# Patient Record
Sex: Female | Born: 1970 | Race: White | Hispanic: No | Marital: Married | Smoking: Never smoker
Health system: Southern US, Community
[De-identification: ages and names within clinical notes are randomized; demographics above are authoritative.]

## PROBLEM LIST (undated history)

## (undated) DIAGNOSIS — G43909 Migraine, unspecified, not intractable, without status migrainosus: Secondary | ICD-10-CM

## (undated) DIAGNOSIS — N289 Disorder of kidney and ureter, unspecified: Secondary | ICD-10-CM

## (undated) HISTORY — PX: CYSTOSCOPY: SUR368

---

## 2016-08-13 ENCOUNTER — Emergency Department (INDEPENDENT_AMBULATORY_CARE_PROVIDER_SITE_OTHER)
Admission: EM | Admit: 2016-08-13 | Discharge: 2016-08-13 | Disposition: A | Discharge status: Home / Self Care | Payer: 59 | Source: Home / Self Care | Attending: Family Medicine | Admitting: Family Medicine

## 2016-08-13 ENCOUNTER — Emergency Department (INDEPENDENT_AMBULATORY_CARE_PROVIDER_SITE_OTHER): Payer: 59

## 2016-08-13 DIAGNOSIS — B349 Viral infection, unspecified: Secondary | ICD-10-CM | POA: Diagnosis not present

## 2016-08-13 DIAGNOSIS — R05 Cough: Secondary | ICD-10-CM

## 2016-08-13 DIAGNOSIS — J9801 Acute bronchospasm: Secondary | ICD-10-CM | POA: Diagnosis not present

## 2016-08-13 DIAGNOSIS — R531 Weakness: Secondary | ICD-10-CM

## 2016-08-13 HISTORY — DX: Migraine, unspecified, not intractable, without status migrainosus: G43.909

## 2016-08-13 HISTORY — DX: Disorder of kidney and ureter, unspecified: N28.9

## 2016-08-13 LAB — COMPLETE METABOLIC PANEL WITH GFR
ALT: 5 U/L — ABNORMAL LOW (ref 6–29)
AST: 12 U/L (ref 10–30)
Albumin: 4.1 g/dL (ref 3.6–5.1)
Alkaline Phosphatase: 84 U/L (ref 33–115)
BUN: 5 mg/dL — ABNORMAL LOW (ref 7–25)
CO2: 31 mmol/L (ref 20–31)
Calcium: 9.2 mg/dL (ref 8.6–10.2)
Chloride: 102 mmol/L (ref 98–110)
Creat: 0.57 mg/dL (ref 0.50–1.10)
GFR, Est African American: >89 mL/min (ref >=60)
GFR, Est Non African American: >89 mL/min (ref >=60)
Glucose, Bld: 119 mg/dL — ABNORMAL HIGH (ref 65–99)
Potassium: 3.5 mmol/L (ref 3.5–5.3)
Sodium: 141 mmol/L (ref 135–146)
Total Bilirubin: 0.3 mg/dL (ref 0.2–1.2)
Total Protein: 6.7 g/dL (ref 6.1–8.1)

## 2016-08-13 LAB — POCT CBC W AUTO DIFF (K'VILLE URGENT CARE)

## 2016-08-13 MED ORDER — ALBUTEROL SULFATE HFA 108 (90 BASE) MCG/ACT IN AERS: 1.0000 | INHALATION_SPRAY | Freq: Four times a day (QID) | RESPIRATORY_TRACT | 0 refills | Status: AC | PRN

## 2016-08-13 MED ORDER — PREDNISONE 20 MG PO TABS: ORAL_TABLET | ORAL | 0 refills | Status: AC

## 2016-08-13 NOTE — ED Triage Notes (Signed)
Pt was seen last Monday, tested neg for flu.  Was given tamiflu, but did not take due to previous issues.  Now has burning in the chest and back, mostly at night and coughs to the point of vomiting at night.

## 2016-08-13 NOTE — ED Notes (Signed)
To x-ray

## 2016-08-13 NOTE — ED Provider Notes (Signed)
CSN: [id number]     Arrival date & time 08/13/16  1346 History   First MD Initiated Contact with Patient 08/13/16 1412     Chief Complaint  Patient presents with  . Fever  . Cough  . Fatigue   (Consider location/radiation/quality/duration/timing/severity/associated sxs/prior Treatment) HPI Ana Malone is a 46 y.o. female presenting to UC with c/o worsening cough that is dry to productive, worse at night while lying down.  She was seen by her PCP earlier last week for flu-like symptoms, tested negative for the flu but was prescribed Tamiflu for possible false-negative.  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  Because of this, she was hesitant to take the Tamiflu and ultimately did not take it.  She did take Azithromycin and completed the course of antibiotic today but is not feeling any better. She has also taken Tessalon, Dayquil and Nyquil w/o relief.  Hx of bronchospasm several years ago and did have an inhaler but denies known asthma or COPD. She has never been a smoker. Denies SOB at this time but does feel burning in her chest and had post-tussive vomiting last night.    Past Medical History:  Diagnosis Date  . Migraines   . Renal disorder    kidney stones   Past Surgical History:  Procedure Laterality Date  . CESAREAN SECTION WITH BILATERAL TUBAL LIGATION    . CYSTOSCOPY     Family History  Problem Relation Age of Onset  . Migraines Mother   . Seizures Mother   . Heart failure Father   . Diabetes Father    Social History  Substance Use Topics  . Smoking status: Never Smoker  . Smokeless tobacco: Not on file  . Alcohol use No   OB History    No data available     Review of Systems  Constitutional: Positive for fatigue. Negative for chills and fever.  HENT: Positive for congestion and sore throat (from cough). Negative for ear pain, trouble swallowing and voice change.    Respiratory: Positive for cough and chest tightness. Negative for shortness of breath.   Cardiovascular: Negative for chest pain and palpitations.  Gastrointestinal: Positive for vomiting ( post-tussive). Negative for abdominal pain, diarrhea and nausea.  Musculoskeletal: Positive for arthralgias, back pain and myalgias.  Skin: Negative for rash.  Neurological: Positive for light-headedness and headaches. Negative for dizziness and syncope.    Allergies  Amoxicillin and Other  Home Medications   Prior to Admission medications   Medication Sig Start Date End Date Taking? Authorizing Provider  butalbital-acetaminophen-caffeine (FIORICET, ESGIC) 50-325-40 MG tablet Take by mouth 2 (two) times daily as needed for headache.   Yes Historical Provider, MD  albuterol (PROVENTIL HFA;VENTOLIN HFA) 108 (90 Base) MCG/ACT inhaler Inhale 1-2 puffs into the lungs every 6 (six) hours as needed for wheezing or shortness of breath. 08/13/16   Junius FinnerErin O'Malley, PA-C  predniSONE (DELTASONE) 20 MG tablet 3 tabs po day one, then 2 po daily x 4 days 08/13/16   Junius FinnerErin O'Malley, PA-C   Meds Ordered and Administered this Visit  Medications - No data to display  BP 119/85 (BP Location: Left Arm)   Pulse 114   Temp 98.4 F (36.9 C) (Tympanic)   Ht 5' 4.5" (1.638 m)   Wt 108 lb (49 kg)   LMP 08/04/2016   SpO2 98%   BMI 18.25 kg/m  No data found.  Physical Exam  Constitutional: She is oriented to person, place, and time. She appears well-developed and well-nourished. No distress.  Thin female sitting on exam bed, NAD. Non-toxic appearing.  HENT:  Head: Normocephalic and atraumatic.  Right Ear: Tympanic membrane normal.  Left Ear: Tympanic membrane normal.  Nose: Nose normal.  Mouth/Throat: Uvula is midline, oropharynx is clear and moist and mucous membranes are normal.  Eyes: EOM are normal.  Neck: Normal range of motion. Neck supple.  Cardiovascular: Normal rate and regular rhythm.   Pulmonary/Chest:  Effort normal and breath sounds normal. No stridor. No respiratory distress. She has no wheezes. She has no rales.  Musculoskeletal: Normal range of motion.  Lymphadenopathy:    She has no cervical adenopathy.  Neurological: She is alert and oriented to person, place, and time.  Skin: Skin is warm and dry. She is not diaphoretic.  Psychiatric: She has a normal mood and affect. Her behavior is normal.  Nursing note and vitals reviewed.   Urgent Care Course     Procedures (including critical care time)  Labs Review Labs Reviewed  COMPLETE METABOLIC PANEL WITH GFR  POCT CBC W AUTO DIFF ([facility])    Imaging Review Dg Chest 2 View  Result Date: 08/13/2016 CLINICAL DATA:  Cough, weakness EXAM: CHEST  2 VIEW COMPARISON:  None. FINDINGS: The lungs are hyperinflated likely secondary to COPD. There is no focal parenchymal opacity. There is no pleural effusion or pneumothorax. The heart and mediastinal contours are unremarkable. The osseous structures are unremarkable. IMPRESSION: No active cardiopulmonary disease. Electronically Signed   By: Elige Ko   On: 08/13/2016 14:40    MDM   1. Acute bronchospasm due to viral infection   2. Generalized weakness    Pt c/o worsening URI symptoms, especially the cough. O2 Sat 98% on RA She did complete a course of Azithromycin today (started on Wednesday)  CXR: no active cardiopulmonary disease. Evidence of possible COPD Pt denies known hx of asthma or COPD, denies ever smoking.   Rx: Prednisone and albuterol inhaler  F/u with PCP later this week if not improving.     Junius Finner, PA-C 08/13/16 1656

## 2016-08-16 ENCOUNTER — Telehealth: Payer: Self-pay | Admitting: *Deleted

## 2016-08-16 NOTE — Telephone Encounter (Signed)
Pt given lab results. Per dr Cathren Harshbeese increase protein intake due to low BUN and f/u with PCP if she fails to improve.

## 2018-05-11 IMAGING — DX DG CHEST 2V
2 series · 2 of 2 positions shown · non-contrast
Comparison: None.

CLINICAL DATA: Cough, weakness

EXAM:
CHEST  2 VIEW

[chest pa]
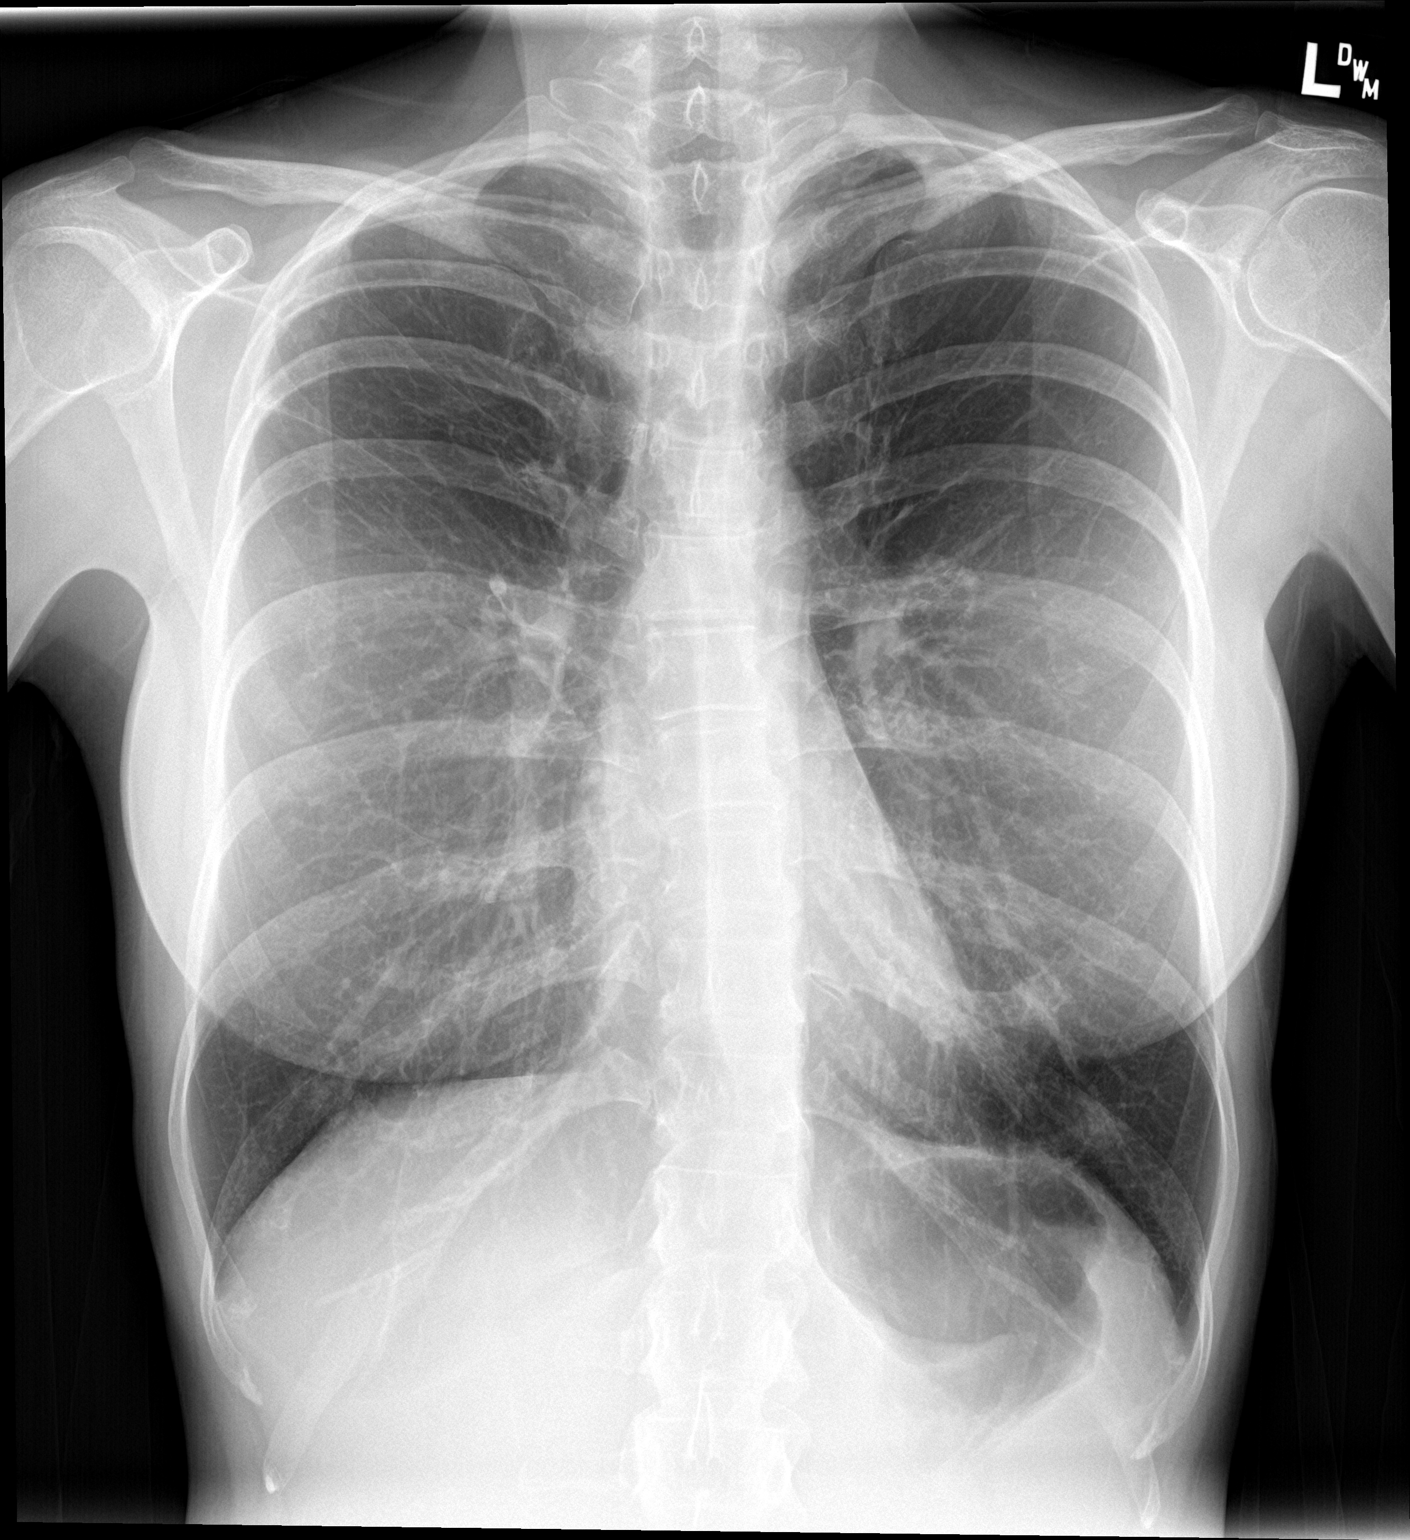

[chest lat]
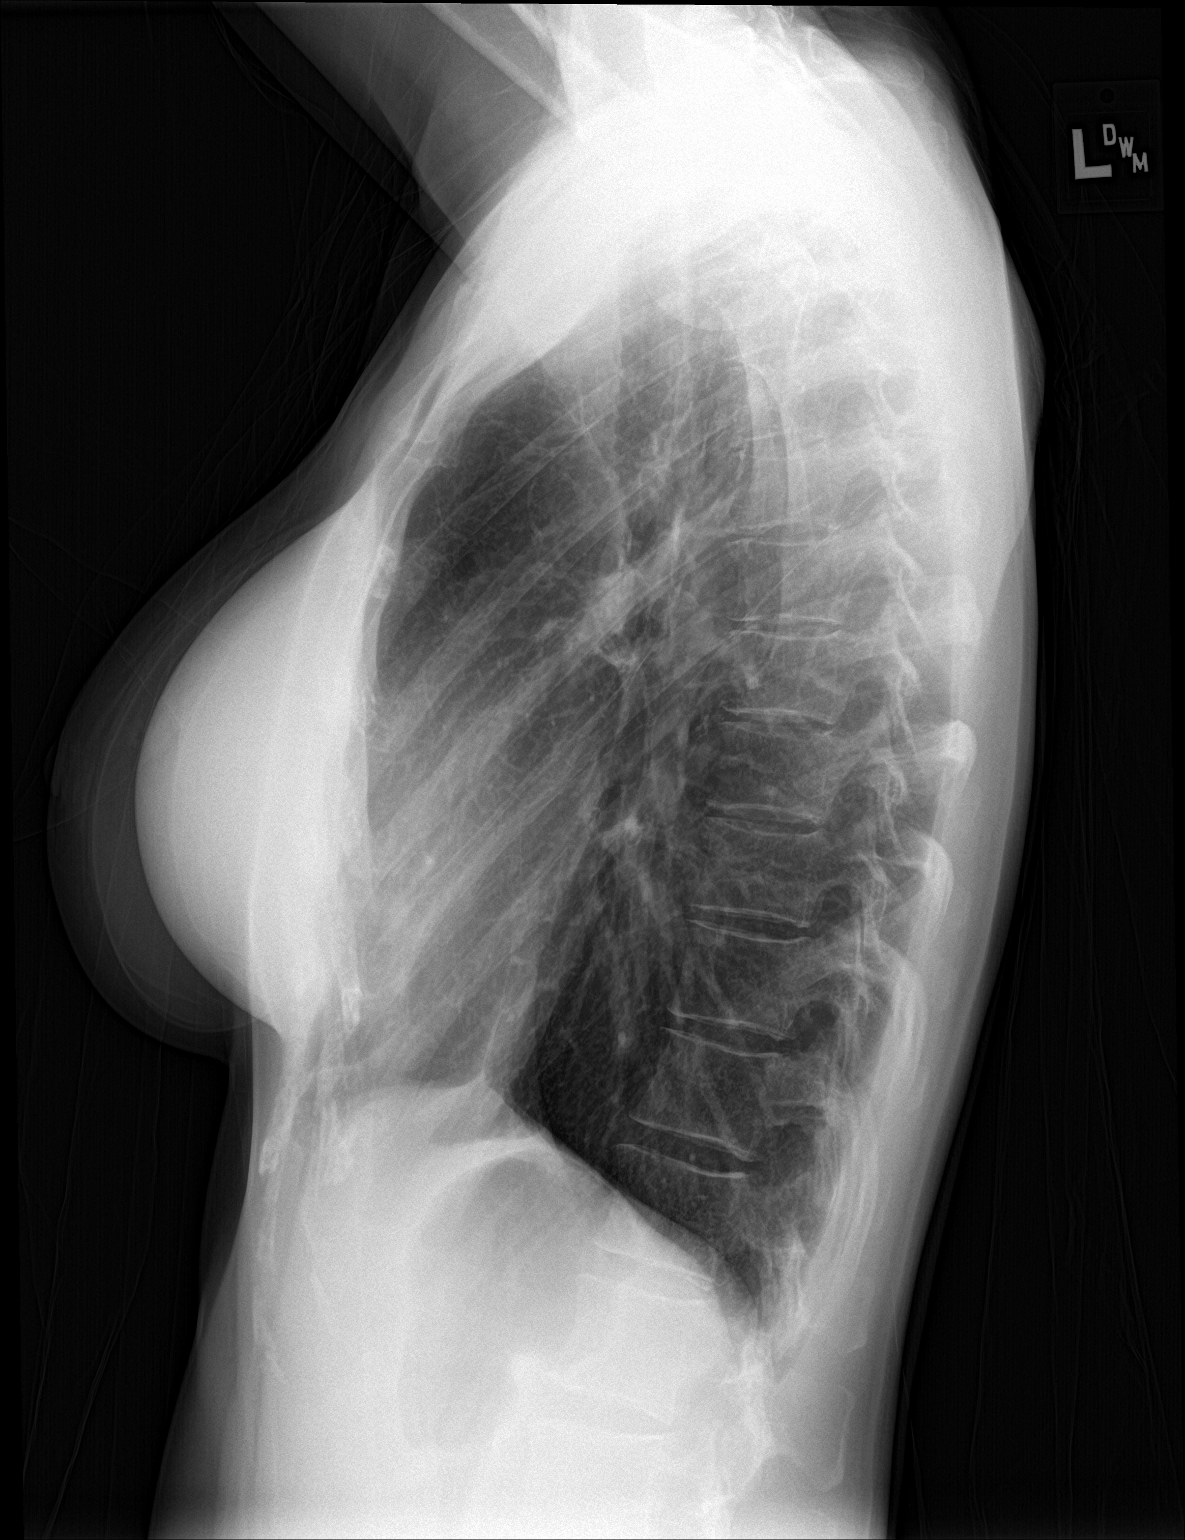

[2 of 2 positions shown; findings below may reference images not displayed]

FINDINGS: The lungs are hyperinflated likely secondary to COPD. There is no
focal parenchymal opacity. There is no pleural effusion or
pneumothorax. The heart and mediastinal contours are unremarkable.

The osseous structures are unremarkable.
IMPRESSION: No active cardiopulmonary disease.
